# Patient Record
Sex: Female | Born: 2000 | Race: White | Hispanic: No | Marital: Single | Smoking: Never smoker
Health system: Southern US, Community
[De-identification: ages and names within clinical notes are randomized; demographics above are authoritative.]

## PROBLEM LIST (undated history)

## (undated) DIAGNOSIS — F329 Major depressive disorder, single episode, unspecified: Secondary | ICD-10-CM

## (undated) DIAGNOSIS — G479 Sleep disorder, unspecified: Secondary | ICD-10-CM

## (undated) DIAGNOSIS — Z23 Encounter for immunization: Secondary | ICD-10-CM

## (undated) DIAGNOSIS — F32A Depression, unspecified: Secondary | ICD-10-CM

## (undated) DIAGNOSIS — F419 Anxiety disorder, unspecified: Secondary | ICD-10-CM

## (undated) HISTORY — DX: Anxiety disorder, unspecified: F41.9

## (undated) HISTORY — DX: Sleep disorder, unspecified: G47.9

## (undated) HISTORY — DX: Depression, unspecified: F32.A

## (undated) HISTORY — DX: Encounter for immunization: Z23

---
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
## 2018-12-17 ENCOUNTER — Ambulatory Visit (INDEPENDENT_AMBULATORY_CARE_PROVIDER_SITE_OTHER): Payer: 59 | Admitting: Obstetrics and Gynecology

## 2018-12-17 ENCOUNTER — Other Ambulatory Visit (HOSPITAL_COMMUNITY)
Admission: RE | Admit: 2018-12-17 | Discharge: 2018-12-17 | Disposition: A | Discharge status: Home / Self Care | Payer: 59 | Source: Ambulatory Visit | Attending: Obstetrics and Gynecology | Admitting: Obstetrics and Gynecology

## 2018-12-17 ENCOUNTER — Other Ambulatory Visit: Payer: Self-pay

## 2018-12-17 ENCOUNTER — Encounter: Payer: Self-pay | Admitting: Obstetrics and Gynecology

## 2018-12-17 ENCOUNTER — Other Ambulatory Visit: Payer: Self-pay | Admitting: Obstetrics and Gynecology

## 2018-12-17 VITALS — BP 108/70 | Ht 68.0 in | Wt 130.0 lb

## 2018-12-17 DIAGNOSIS — Z01419 Encounter for gynecological examination (general) (routine) without abnormal findings: Secondary | ICD-10-CM | POA: Diagnosis not present

## 2018-12-17 DIAGNOSIS — Z113 Encounter for screening for infections with a predominantly sexual mode of transmission: Secondary | ICD-10-CM | POA: Insufficient documentation

## 2018-12-17 DIAGNOSIS — Z30430 Encounter for insertion of intrauterine contraceptive device: Secondary | ICD-10-CM

## 2018-12-17 DIAGNOSIS — Z803 Family history of malignant neoplasm of breast: Secondary | ICD-10-CM | POA: Insufficient documentation

## 2018-12-17 DIAGNOSIS — Z30014 Encounter for initial prescription of intrauterine contraceptive device: Secondary | ICD-10-CM

## 2018-12-17 MED ORDER — KYLEENA 19.5 MG IU IUD: 19.5000 mg | INTRAUTERINE_SYSTEM | Freq: Once | INTRAUTERINE | 0 refills | Status: AC

## 2018-12-17 NOTE — Patient Instructions (Signed)
I value your feedback and entrusting us with your care. If you get a [facility] patient survey, I would appreciate you taking the time to let us know about your experience today. Thank you! 

## 2018-12-17 NOTE — Progress Notes (Signed)
PCP:  Patient, No Pcp Per   Chief Complaint  Patient presents with  . Gynecologic Exam  . Contraception    Interested in IUD, not on [facility]     HPI:      Ms. Teresa Cowan is a 18 y.o. No obstetric history on file. who LMP was Patient's last menstrual period was 12/15/2018 (exact date)., presents today for her NP annual examination.  Her menses are regular every 28-30 days, lasting 7 days.  Dysmenorrhea mild, occurring first 1-2 days of flow. She does not have intermenstrual bleeding.  Sex activity: single partner, contraception - condoms. Would like IUD. Never been on BC in past. No hx of HTN, DVT, migraines with aura. Last Pap: N/A Hx of STDs: none  There is a FH of breast cancer in her MGM and mat aunt, genetic testing not done. There is no FH of ovarian cancer. The patient does not do self-breast exams.  Tobacco use: The patient denies current or previous tobacco use. Alcohol use: social drinker No drug use.  Exercise: not active  She does get adequate calcium and Vitamin D in her diet. Gardasil completed.   Past Medical History:  Diagnosis Date  . Anxiety   . Depression   . Sleep disorder   . Vaccine for human papilloma virus (HPV) types 6, 11, 16, and 18 administered     History reviewed. No pertinent surgical history.  Family History  Problem Relation Age of Onset  . Fibroids Mother   . Breast cancer Maternal Aunt 35  . Breast cancer Maternal Grandmother 72  . Breast cancer Maternal Aunt 4    Social History   Socioeconomic History  . Marital status: Single    Spouse name: Not on file  . Number of children: Not on file  . Years of education: Not on file  . Highest education level: Not on file  Occupational History  . Not on file  Social Needs  . Financial resource strain: Not on file  . Food insecurity    Worry: Not on file    Inability: Not on file  . Transportation needs    Medical: Not on file    Non-medical: Not on file  Tobacco Use  .  Smoking status: Never Smoker  . Smokeless tobacco: Never Used  Substance and Sexual Activity  . Alcohol use: Yes  . Drug use: Never  . Sexual activity: Yes    Birth control/protection: Condom  Lifestyle  . Physical activity    Days per week: Not on file    Minutes per session: Not on file  . Stress: Not on file  Relationships  . Social Herbalist on phone: Not on file    Gets together: Not on file    Attends religious service: Not on file    Active member of club or organization: Not on file    Attends meetings of clubs or organizations: Not on file    Relationship status: Not on file  . Intimate partner violence    Fear of current or ex partner: Not on file    Emotionally abused: Not on file    Physically abused: Not on file    Forced sexual activity: Not on file  Other Topics Concern  . Not on file  Social History Narrative  . Not on file     Current Outpatient Medications:  .  mirtazapine (REMERON) 15 MG tablet, take 1 tablet by mouth at bedtime FOR 30 DAYS,  Disp: , Rfl:  .  Levonorgestrel (KYLEENA) 19.5 MG IUD, 1 each (19.5 mg total) by Intrauterine route once for 1 dose., Disp: 1 Intra Uterine Device, Rfl: 0     ROS:  Review of Systems  Constitutional: Negative for fatigue, fever and unexpected weight change.  Respiratory: Negative for cough, shortness of breath and wheezing.   Cardiovascular: Negative for chest pain, palpitations and leg swelling.  Gastrointestinal: Negative for blood in stool, constipation, diarrhea, nausea and vomiting.  Endocrine: Negative for cold intolerance, heat intolerance and polyuria.  Genitourinary: Negative for dyspareunia, dysuria, flank pain, frequency, genital sores, hematuria, menstrual problem, pelvic pain, urgency, vaginal bleeding, vaginal discharge and vaginal pain.  Musculoskeletal: Negative for back pain, joint swelling and myalgias.  Skin: Negative for rash.  Neurological: Negative for dizziness, syncope,  light-headedness, numbness and headaches.  Hematological: Negative for adenopathy.  Psychiatric/Behavioral: Negative for agitation, confusion, sleep disturbance and suicidal ideas. The patient is not nervous/anxious.   BREAST: No symptoms   Objective: BP 108/70   Ht 5\' 8"  (1.727 m)   Wt 130 lb (59 kg)   LMP 12/15/2018 (Exact Date)   BMI 19.77 kg/m    Physical Exam Constitutional:      Appearance: She is well-developed.  Genitourinary:     Vulva, vagina, cervix, uterus, right adnexa and left adnexa normal.     No vulval lesion or tenderness noted.     No vaginal discharge, erythema or tenderness.     No cervical polyp.     Uterus is not enlarged or tender.     No right or left adnexal mass present.     Right adnexa not tender.     Left adnexa not tender.  Neck:     Musculoskeletal: Normal range of motion.     Thyroid: No thyromegaly.  Cardiovascular:     Rate and Rhythm: Normal rate and regular rhythm.     Heart sounds: Normal heart sounds. No murmur.  Pulmonary:     Effort: Pulmonary effort is normal.     Breath sounds: Normal breath sounds.  Chest:     Breasts:        Right: No mass, nipple discharge, skin change or tenderness.        Left: No mass, nipple discharge, skin change or tenderness.  Abdominal:     Palpations: Abdomen is soft.     Tenderness: There is no abdominal tenderness. There is no guarding.  Musculoskeletal: Normal range of motion.  Neurological:     General: No focal deficit present.     Mental Status: She is alert and oriented to person, place, and time.     Cranial Nerves: No cranial nerve deficit.  Skin:    General: Skin is warm and dry.  Psychiatric:        Mood and Affect: Mood normal.        Behavior: Behavior normal.        Thought Content: Thought content normal.        Judgment: Judgment normal.  Vitals signs reviewed.    IUD Insertion Procedure Note Patient identified, informed consent performed, consent signed.   Discussed  risks of irregular bleeding, cramping, infection, malpositioning or misplacement of the IUD outside the uterus which may require further procedure such as laparoscopy, risk of failure <1%. Time out was performed.    Speculum placed in the vagina.  Cervix visualized.  Cleaned with Betadine x 2.  Grasped anteriorly with a single tooth tenaculum.  Uterus sounded  to 7.0 cm.   IUD placed per manufacturer's recommendations.  Strings trimmed to 3 cm. Tenaculum was removed, good hemostasis noted.  Patient tolerated procedure well.    Assessment/Plan: Encounter for annual routine gynecological examination  Screening for STD (sexually transmitted disease) - Plan: Cervicovaginal ancillary only  Encounter for initial prescription of intrauterine contraceptive device (IUD) - Plan: Levonorgestrel (KYLEENA) 19.5 MG IUD; Pros/cons/risks/benefits discussed. Pt on menses today and would like to proceed with Kyleela IUD.  Family history of breast cancer--MyRisk testing discussed for pt's mom or affected mat aunt. Pt qualifies for testing age 18. Handout given.  Encounter for IUD insertion - Plan: Levonorgestrel (KYLEENA) 19.5 MG IUD  Meds ordered this encounter  Medications  . Levonorgestrel (KYLEENA) 19.5 MG IUD    Sig: 1 each (19.5 mg total) by Intrauterine route once for 1 dose.    Dispense:  1 Intra Uterine Device    Refill:  0    Order Specific Question:   Supervising Provider    Answer:   Nadara MustardHARRIS, ROBERT P [[id number]]             GYN counsel family planning choices, adequate intake of calcium and vitamin D, diet and exercise  She was advised to have backup contraception for one week.   Call if you are having increasing pain, cramps or bleeding or if you have a fever greater than 100.4 degrees F., shaking chills, nausea or vomiting. Patient was also asked to check IUD strings periodically and follow up in 4 weeks for IUD check.    F/U  Return in about 4 weeks (around 01/14/2019) for IUD f/u.  Alicia  B. Copland, PA-C 12/17/2018 2:48 PM

## 2018-12-19 LAB — CERVICOVAGINAL ANCILLARY ONLY
Chlamydia: NEGATIVE
Neisseria Gonorrhea: NEGATIVE

## 2019-01-28 ENCOUNTER — Encounter: Payer: Self-pay | Admitting: Obstetrics and Gynecology

## 2019-01-28 ENCOUNTER — Other Ambulatory Visit: Payer: Self-pay

## 2019-01-28 ENCOUNTER — Ambulatory Visit (INDEPENDENT_AMBULATORY_CARE_PROVIDER_SITE_OTHER): Payer: 59 | Admitting: Obstetrics and Gynecology

## 2019-01-28 VITALS — BP 90/60 | Ht 68.0 in | Wt 130.0 lb

## 2019-01-28 DIAGNOSIS — Z30431 Encounter for routine checking of intrauterine contraceptive device: Secondary | ICD-10-CM

## 2019-01-28 DIAGNOSIS — L700 Acne vulgaris: Secondary | ICD-10-CM

## 2019-01-28 NOTE — Progress Notes (Signed)
   Chief Complaint  Patient presents with  . IUD check    pts face is breaking out a lot     History of Present Illness:  Teresa Cowan is a 18 y.o. that had a [place] IUD placed approximately 5 weeks ago. Since that time, she has had daily light spotting for most of the time, although less the past 2 days. No heavy bleeding, unusual d/c. Pt has not been sex active since placed. No dysmen. She has had increased acne on her cheeks, cystic initially but getting a little better now. Treating with acne face wash. Hx of adolescent acne in past, treated with Epiduo in past.   Review of Systems  Constitutional: Negative for fever.  Gastrointestinal: Negative for blood in stool, constipation, diarrhea, nausea and vomiting.  Genitourinary: Negative for dyspareunia, dysuria, flank pain, frequency, hematuria, urgency, vaginal bleeding, vaginal discharge and vaginal pain.  Musculoskeletal: Negative for back pain.  Skin: Negative for rash.    Physical Exam:  BP 90/60   Ht 5\' 8"  (1.727 m)   Wt 130 lb (59 kg)   BMI 19.77 kg/m  Body mass index is 19.77 kg/m.  Pelvic exam:  Two IUD strings present seen coming from the cervical os. EGBUS, vaginal vault and cervix: within normal limits   Assessment:   Encounter for routine checking of intrauterine contraceptive device (IUD)--IUD strings present in proper location; pt doing well  Breakthrough bleeding with IUD--reassurance. Normal. F/u prn.  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 Should improve, cont OTC acne tx. Can try differin gel. Will Rx epiduo prn. Also discussed IUD removal if sx don't improve.    Plan: F/u if any signs of infection or can no longer feel the strings.   Alicia B. Copland, PA-C 01/28/2019 3:52 PM

## 2019-01-28 NOTE — Patient Instructions (Signed)
I value your feedback and entrusting us with your care. If you get a [facility] patient survey, I would appreciate you taking the time to let us know about your experience today. Thank you! 

## 2019-11-12 ENCOUNTER — Encounter: Payer: Self-pay | Admitting: Obstetrics and Gynecology

## 2020-01-25 ENCOUNTER — Telehealth: Payer: Self-pay

## 2020-01-25 NOTE — Telephone Encounter (Signed)
Pt qualifies but since her mom was just diagnosed with breast cancer, her mom should do genetic testing with her oncologist. If her mom is negative, then pt is neg. If her mom is positive, then we'll test pt. Don't do any intervention until age 19 anyway.

## 2020-01-25 NOTE — Telephone Encounter (Signed)
Pt left msg on triage asking for her fam history to be updated. Her mom was just diagnosed with Breast Cancer at age 21- Stage 1 Grade 2. She wants to know more abot genetic testing you and her had talked to her. Advised she will be due for her annual as of 10/28. She wants your advise, is this something she should get to right away? Pt will call back to schedule annual.

## 2020-01-26 NOTE — Telephone Encounter (Signed)
Called pt, no answer, mail box full unable to leave voice msg. 

## 2020-12-17 ENCOUNTER — Ambulatory Visit
Admission: EM | Admit: 2020-12-17 | Discharge: 2020-12-17 | Disposition: A | Discharge status: Home / Self Care | Payer: BC Managed Care – PPO

## 2020-12-17 ENCOUNTER — Other Ambulatory Visit: Payer: Self-pay

## 2020-12-17 DIAGNOSIS — E860 Dehydration: Secondary | ICD-10-CM | POA: Diagnosis not present

## 2020-12-17 DIAGNOSIS — R350 Frequency of micturition: Secondary | ICD-10-CM | POA: Diagnosis not present

## 2020-12-17 LAB — POCT URINALYSIS DIP (MANUAL ENTRY)
Bilirubin, UA: NEGATIVE
Glucose, UA: NEGATIVE mg/dL
Ketones, POC UA: NEGATIVE mg/dL
Leukocytes, UA: NEGATIVE
Nitrite, UA: NEGATIVE
Protein Ur, POC: 100 mg/dL — AB
Spec Grav, UA: >=1.03 — AB (ref 1.010–1.025)
Urobilinogen, UA: 0.2 E.U./dL
pH, UA: 6 (ref 5.0–8.0)

## 2020-12-17 NOTE — Discharge Instructions (Addendum)
Push fluids  Return with any worsening symptoms, fever, unilateral back pain, vomiting

## 2020-12-17 NOTE — ED Provider Notes (Signed)
Subjective:    Teresa Cowan is a very pleasant 20 y.o. female who presents with concerns for UTI due to urinary frequency. No unilateral back pain, vomiting, fever, vaginal discharge, concern for STD.  Past medical history, past surgical history, current medications reviewed.  Allergies: has No Known Allergies.  Review of Systems See HPI   Objective:     Vitals:   12/17/20 1347  BP: 117/79  Pulse: 80  Resp: 16  Temp: 97.7 F (36.5 C)  SpO2: 97%     General: Appears well-developed and well-nourished. No acute distress.  Cardiovascular: Normal rate Pulm/Chest: No respiratory distress Neurological: Alert and oriented to person, place, and time.  Skin: Skin is warm and dry.  Psychiatric: Normal mood, affect, behavior, and thought content.  GU:  Deferred secondary to self collect specimen  Laboratory:  Orders Placed This Encounter  Procedures   POCT urinalysis dipstick   Results for orders placed or performed during the hospital encounter of 12/17/20  POCT urinalysis dipstick  Result Value Ref Range   Color, UA yellow yellow   Clarity, UA clear clear   Glucose, UA negative negative mg/dL   Bilirubin, UA negative negative   Ketones, POC UA negative negative mg/dL   Spec Grav, UA >=1.062 (A) 1.010 - 1.025   Blood, UA trace-intact (A) negative   pH, UA 6.0 5.0 - 8.0   Protein Ur, POC =100 (A) negative mg/dL   Urobilinogen, UA 0.2 0.2 or 1.0 E.U./dL   Nitrite, UA Negative Negative   Leukocytes, UA Negative Negative     Assessment:   1. Dehydration  2. Urinary frequency  Plan:   MDM: Patient presents with concerns for UTI due to urinary frequency. No unilateral back pain, vomiting, fever, vaginal discharge, concern for STD. Urinalysis shows increased specific gravity, trace blood, and 100 protein. No concern for UTI today, no leuks and no nitrites. Likely dehydration with urinary frequency. Push fluids. Return with any worsening symptoms to include fever,  chills, unilateral back pain, vomiting.    Discharge Instructions      Push fluids  Return with any worsening symptoms, fever, unilateral back pain, vomiting         Amalia Greenhouse, [place] 12/17/20 1405

## 2020-12-17 NOTE — ED Triage Notes (Signed)
Patient presents to Urgent Care with complaints of UTI symptoms. She has had freq UTIs in the past two months.   Denies fever.

## 2021-02-13 ENCOUNTER — Ambulatory Visit
Admission: EM | Admit: 2021-02-13 | Discharge: 2021-02-13 | Disposition: A | Discharge status: Home / Self Care | Payer: BC Managed Care – PPO

## 2021-02-13 ENCOUNTER — Encounter: Payer: Self-pay | Admitting: Emergency Medicine

## 2021-02-13 DIAGNOSIS — J0100 Acute maxillary sinusitis, unspecified: Secondary | ICD-10-CM

## 2021-02-13 MED ORDER — AMOXICILLIN 875 MG PO TABS: 875.0000 mg | ORAL_TABLET | Freq: Two times a day (BID) | ORAL | 0 refills | Status: AC

## 2021-02-13 NOTE — Discharge Instructions (Addendum)
Take the amoxicillin as directed.  Continue taking plain Mucinex and ibuprofen as directed.  Follow up with your primary care provider if your symptoms are not improving.

## 2021-02-13 NOTE — ED Triage Notes (Signed)
Pt c/o runny nose (green mucus), sinus pressure/pain, cough, bilateral ear pain x 1 week

## 2021-02-13 NOTE — ED Provider Notes (Signed)
[facility]    CSN: [id number] Arrival date & time: 02/13/21  1530      History   Chief Complaint Chief Complaint  Patient presents with   Cough   Sinus pressure    HPI Teresa Cowan is a 20 y.o. female.  Patient presents with >1 week history of ear pain, runny nose, sinus pressure, congestion, cough.  Her throat is sore when she coughs.  She denies fever, chills, rash, shortness of breath, vomiting, diarrhea, other symptoms.  Treatment at home with Mucinex.  The history is provided by the patient.   Past Medical History:  Diagnosis Date   Anxiety    Depression    Sleep disorder    Vaccine for human papilloma virus (HPV) types 6, 11, 16, and 18 administered     Patient Active Problem List   Diagnosis Date Noted   Acne vulgaris 01/28/2019   Family history of breast cancer 12/17/2018    History reviewed. No pertinent surgical history.  OB History     Gravida  0   Para  0   Term  0   Preterm  0   AB  0   Living  0      SAB  0   IAB  0   Ectopic  0   Multiple  0   Live Births  0            Home Medications    Prior to Admission medications   Medication Sig Start Date End Date Taking? Authorizing Provider  amoxicillin (AMOXIL) 875 MG tablet Take 1 tablet (875 mg total) by mouth 2 (two) times daily for 7 days. 02/13/21 02/20/21 Yes Mickie Bail, NP  clindamycin (CLEOCIN T) 1 % lotion  Start: 08/09/20 13:41:00 EDT, 1 appl, topical, qhs 08/09/20  Yes [provider]  Cyanocobalamin (VITAMIN B 12) 500 MCG TABS 1 tablet 11/10/18  Yes [provider]  levonorgestrel Rutha Bouchard) 19.5 MG IUD  Start: 08/09/20 13:55:00 EDT, 1 each, intrauterine, ONCE, Disp# 1 each, placed in 2020, other 08/09/20  Yes [provider]  mirtazapine (REMERON) 15 MG tablet take 1 tablet by mouth at bedtime FOR 30 DAYS 10/09/18  Yes [provider]  spironolactone (ALDACTONE) 25 MG tablet  Start: 08/09/20 13:41:00 EDT, 1 tab, PO,  Daily 08/09/20  Yes [provider]  atomoxetine (STRATTERA) 18 MG capsule 1 capsule 06/06/20   [provider]  Cyanocobalamin (VITAMIN B 12) 500 MCG TABS 1 tablet 11/10/18   [provider]  Levonorgestrel (KYLEENA) 19.5 MG IUD 1 each (19.5 mg total) by Intrauterine route once for 1 dose. 12/17/18 12/17/18  Copland, Ilona Sorrel, PA-C  mirtazapine (REMERON) 15 MG tablet Take 1 tablet by mouth at bedtime. 08/09/20   [provider]    Family History Family History  Problem Relation Age of Onset   Fibroids Mother    Breast cancer Mother 51       Stage 1 Grade 2   Breast cancer Maternal Aunt 35   Breast cancer Maternal Grandmother 17   Breast cancer Maternal Aunt 70    Social History Social History   Tobacco Use   Smoking status: Never   Smokeless tobacco: Never  Vaping Use   Vaping Use: Some days  Substance Use Topics   Alcohol use: Yes   Drug use: Never     Allergies   Patient has no known allergies.   Review of Systems Review of Systems  Constitutional:  Negative for chills and fever.  HENT:  Positive for congestion, ear pain, postnasal drip, rhinorrhea and sinus pressure. Negative for sore throat.   Respiratory:  Positive for cough. Negative for shortness of breath.   Cardiovascular:  Negative for chest pain and palpitations.  Gastrointestinal:  Negative for diarrhea and vomiting.  Skin:  Negative for color change and rash.  All other systems reviewed and are negative.   Physical Exam Triage Vital Signs ED Triage Vitals  Enc Vitals Group     BP      Pulse      Resp      Temp      Temp src      SpO2      Weight      Height      Head Circumference      Peak Flow      Pain Score      Pain Loc      Pain Edu?      Excl. in GC?    No data found.  Updated Vital Signs BP 102/67 (BP Location: Left Arm)   Pulse 76   Temp 98.2 F (36.8 C) (Oral)   Resp 16   SpO2 97%   Visual Acuity Right Eye Distance:   Left Eye Distance:    Bilateral Distance:    Right Eye Near:   Left Eye Near:    Bilateral Near:     Physical Exam Vitals and nursing note reviewed.  Constitutional:      General: She is not in acute distress.    Appearance: She is well-developed.  HENT:     Head: Normocephalic and atraumatic.     Right Ear: Tympanic membrane normal.     Left Ear: Tympanic membrane normal.     Nose: Congestion and rhinorrhea present.     Mouth/Throat:     Mouth: Mucous membranes are moist.     Pharynx: Oropharynx is clear.  Eyes:     Conjunctiva/sclera: Conjunctivae normal.  Cardiovascular:     Rate and Rhythm: Normal rate and regular rhythm.     Heart sounds: Normal heart sounds.  Pulmonary:     Effort: Pulmonary effort is normal. No respiratory distress.     Breath sounds: Normal breath sounds.  Abdominal:     Palpations: Abdomen is soft.     Tenderness: There is no abdominal tenderness.  Musculoskeletal:     Cervical back: Neck supple.  Skin:    General: Skin is warm and dry.  Neurological:     Mental Status: She is alert.  Psychiatric:        Mood and Affect: Mood normal.        Behavior: Behavior normal.     UC Treatments / Results  Labs (all labs ordered are listed, but only abnormal results are displayed) Labs Reviewed - No data to display  EKG   Radiology No results found.  Procedures Procedures (including critical care time)  Medications Ordered in UC Medications - No data to display  Initial Impression / Assessment and Plan / UC Course  I have reviewed the triage vital signs and the nursing notes.  Pertinent labs & imaging results that were available during my care of the patient were reviewed by me and considered in my medical decision making (see chart for details).  Acute sinusitis.  Patient has been symptomatic for >1 week.  Treating with amoxicillin.  Discussed ongoing symptomatic treatment with Mucinex and ibuprofen.  Instructed her to  follow-up with her PCP if her symptoms  are not improving.  She agrees to plan of care.   Final Clinical Impressions(s) / UC Diagnoses   Final diagnoses:  Acute non-recurrent maxillary sinusitis     Discharge Instructions      Take the amoxicillin as directed.  Continue taking plain Mucinex and ibuprofen as directed.  Follow up with your primary care provider if your symptoms are not improving.        ED Prescriptions     Medication Sig Dispense Auth. Provider   amoxicillin (AMOXIL) 875 MG tablet Take 1 tablet (875 mg total) by mouth 2 (two) times daily for 7 days. 14 tablet Mickie Bail, NP      PDMP not reviewed this encounter.   Mickie Bail, NP 02/13/21 1651

## 2021-03-02 ENCOUNTER — Ambulatory Visit
Admission: RE | Admit: 2021-03-02 | Discharge: 2021-03-02 | Disposition: A | Discharge status: Home / Self Care | Payer: BC Managed Care – PPO | Source: Ambulatory Visit | Attending: Family Medicine | Admitting: Family Medicine

## 2021-03-02 ENCOUNTER — Other Ambulatory Visit: Payer: Self-pay

## 2021-03-02 ENCOUNTER — Ambulatory Visit (INDEPENDENT_AMBULATORY_CARE_PROVIDER_SITE_OTHER): Payer: BC Managed Care – PPO

## 2021-03-02 VITALS — BP 110/70 | HR 78 | Temp 99.0°F | Resp 16

## 2021-03-02 DIAGNOSIS — R059 Cough, unspecified: Secondary | ICD-10-CM

## 2021-03-02 DIAGNOSIS — R052 Subacute cough: Secondary | ICD-10-CM

## 2021-03-02 DIAGNOSIS — R079 Chest pain, unspecified: Secondary | ICD-10-CM

## 2021-03-02 DIAGNOSIS — R0789 Other chest pain: Secondary | ICD-10-CM

## 2021-03-02 MED ORDER — PREDNISONE 20 MG PO TABS: 20.0000 mg | ORAL_TABLET | Freq: Every day | ORAL | 0 refills | Status: AC

## 2021-03-02 MED ORDER — PROMETHAZINE-DM 6.25-15 MG/5ML PO SYRP: 5.0000 mL | ORAL_SOLUTION | Freq: Four times a day (QID) | ORAL | 0 refills | Status: AC | PRN

## 2021-03-02 NOTE — Discharge Instructions (Addendum)
Treating you for inflammation of the chest wall likely secondary to coughing.  X-ray showed no rib fracture and no evolving lung infection.  Start prednisone 20 mg once daily for the next 5 days.  For management of cough promethazine DM 3 times daily.  If symptoms persist in spite of this treatment would recommend further evaluation by your primary care doctor or possibly an asthma and allergy specialist.

## 2021-03-02 NOTE — ED Triage Notes (Signed)
Pt c/o cough and chest congestion. The cough has caused her to have pain on the left side of her chest x 2 days. Pt has had cough for several weeks and was seen twice and prescribed abx each time, but she is not better.

## 2021-03-02 NOTE — ED Provider Notes (Signed)
Renaldo Fiddler    CSN: [id number] Arrival date & time: 03/02/21  1645      History   Chief Complaint Chief Complaint  Patient presents with   Chest Pain   Cough    HPI Teresa Cowan is a 20 y.o. female.   HPI Patient presents today with ongoing cough and left-sided lower chest pain x2 days.  Patient has had a cough and treated for both bronchitis and bronchopneumonia with 2 rounds of antibiotics consisting of doxycycline and Levaquin on 02/14/2021 and 02/21/2021 .  She has not had any imaging.  She does not have any history of asthma.  She reports that her breathing precipitates her chest pain and the chest pain is reproducible if she touches it.  She describes the pain as occasionally sharp but always present.  She did complete both rounds of antibiotics has not been prescribed any steroids. Past Medical History:  Diagnosis Date   Anxiety    Depression    Sleep disorder    Vaccine for human papilloma virus (HPV) types 6, 11, 16, and 18 administered     Patient Active Problem List   Diagnosis Date Noted   Acne vulgaris 01/28/2019   Family history of breast cancer 12/17/2018    History reviewed. No pertinent surgical history.  OB History     Gravida  0   Para  0   Term  0   Preterm  0   AB  0   Living  0      SAB  0   IAB  0   Ectopic  0   Multiple  0   Live Births  0            Home Medications    Prior to Admission medications   Medication Sig Start Date End Date Taking? Authorizing Provider  predniSONE (DELTASONE) 20 MG tablet Take 1 tablet (20 mg total) by mouth daily with breakfast for 5 days. 03/02/21 03/07/21 Yes Bing Neighbors, FNP  promethazine-dextromethorphan (PROMETHAZINE-DM) 6.25-15 MG/5ML syrup Take 5 mLs by mouth 4 (four) times daily as needed for cough. 03/02/21  Yes Bing Neighbors, FNP  atomoxetine (STRATTERA) 18 MG capsule 1 capsule 06/06/20   [provider]  clindamycin (CLEOCIN T) 1 % lotion  Start:  08/09/20 13:41:00 EDT, 1 appl, topical, qhs 08/09/20   [provider]  Cyanocobalamin (VITAMIN B 12) 500 MCG TABS 1 tablet 11/10/18   [provider]  Cyanocobalamin (VITAMIN B 12) 500 MCG TABS 1 tablet 11/10/18   [provider]  Levonorgestrel (KYLEENA) 19.5 MG IUD 1 each (19.5 mg total) by Intrauterine route once for 1 dose. 12/17/18 12/17/18  Copland, Ilona Sorrel, PA-C  levonorgestrel (KYLEENA) 19.5 MG IUD  Start: 08/09/20 13:55:00 EDT, 1 each, intrauterine, ONCE, Disp# 1 each, placed in 2020, other 08/09/20   [provider]  mirtazapine (REMERON) 15 MG tablet take 1 tablet by mouth at bedtime FOR 30 DAYS 10/09/18   [provider]  mirtazapine (REMERON) 15 MG tablet Take 1 tablet by mouth at bedtime. 08/09/20   [provider]  spironolactone (ALDACTONE) 25 MG tablet  Start: 08/09/20 13:41:00 EDT, 1 tab, PO, Daily 08/09/20   [provider]    Family History Family History  Problem Relation Age of Onset   Fibroids Mother    Breast cancer Mother 1       Stage 1 Grade 2   Breast cancer Maternal Aunt 37   Breast cancer  Maternal Grandmother 28   Breast cancer Maternal Aunt 37    Social History Social History   Tobacco Use   Smoking status: Never   Smokeless tobacco: Never  Vaping Use   Vaping Use: Some days  Substance Use Topics   Alcohol use: Yes   Drug use: Never     Allergies   Patient has no known allergies.   Review of Systems Review of Systems Pertinent negatives listed in HPI   Physical Exam Triage Vital Signs ED Triage Vitals  Enc Vitals Group     BP 03/02/21 1715 110/70     Pulse Rate 03/02/21 1715 78     Resp 03/02/21 1715 18     Temp 03/02/21 1715 99 F (37.2 C)     Temp Source 03/02/21 1715 Oral     SpO2 03/02/21 1715 99 %     Weight --      Height --      Head Circumference --      Peak Flow --      Pain Score 03/02/21 1714 7     Pain Loc --      Pain Edu? --      Excl. in GC? --    No  data found.  Updated Vital Signs BP 110/70 (BP Location: Left Arm)   Pulse 78   Temp 99 F (37.2 C) (Oral)   Resp 16   SpO2 99%   Visual Acuity Right Eye Distance:   Left Eye Distance:   Bilateral Distance:    Right Eye Near:   Left Eye Near:    Bilateral Near:     Physical Exam Vitals reviewed.  Constitutional:      Appearance: She is well-developed.  HENT:     Head: Normocephalic.  Eyes:     Extraocular Movements: Extraocular movements intact.     Pupils: Pupils are equal, round, and reactive to light.  Cardiovascular:     Rate and Rhythm: Normal rate and regular rhythm.  Pulmonary:     Effort: Pulmonary effort is normal.  Chest:     Chest wall: Tenderness present.    Neurological:     Mental Status: She is alert.  Psychiatric:        Attention and Perception: Attention normal.        Mood and Affect: Mood normal.        Behavior: Behavior normal.   UC Treatments / Results  Labs (all labs ordered are listed, but only abnormal results are displayed) Labs Reviewed - No data to display  EKG   Radiology DG Chest 2 View  Result Date: 03/02/2021 CLINICAL DATA:  Cough for 6 weeks with chest pain for 2 days. EXAM: CHEST - 2 VIEW COMPARISON:  None. FINDINGS: A mild pectus excavatum deformity. Midline trachea. Normal heart size and mediastinal contours. No pleural effusion or pneumothorax. Clear lungs. IMPRESSION: No active cardiopulmonary disease. Electronically Signed   By: Jeronimo Greaves M.D.   On: 03/02/2021 18:10    Procedures Procedures (including critical care time)  Medications Ordered in UC Medications - No data to display  Initial Impression / Assessment and Plan / UC Course  I have reviewed the triage vital signs and the nursing notes.  Pertinent labs & imaging results that were available during my care of the patient were reviewed by me and considered in my medical decision making (see chart for details).    Chest x-ray negative suspect pleurisy  and inflammation as the source  of right-sided chest wall pain given patient has been coughing for nearly 6 weeks.  Given two courses of antibiotic treatment, is no longer indicated to treat with an additional round.  We will treat with anti-inflammatory, prednisone 20 mg daily for 5 days.  Promethazine DM PRN for cough.  Final Clinical Impressions(s) / UC Diagnoses   Final diagnoses:  Subacute cough  Right-sided chest wall pain     Discharge Instructions      Treating you for inflammation of the chest wall likely secondary to coughing.  X-ray showed no rib fracture and no evolving lung infection.  Start prednisone 20 mg once daily for the next 5 days.  For management of cough promethazine DM 3 times daily.  If symptoms persist in spite of this treatment would recommend further evaluation by your primary care doctor or possibly an asthma and allergy specialist.     ED Prescriptions     Medication Sig Dispense Auth. Provider   predniSONE (DELTASONE) 20 MG tablet Take 1 tablet (20 mg total) by mouth daily with breakfast for 5 days. 5 tablet Bing Neighbors, FNP   promethazine-dextromethorphan (PROMETHAZINE-DM) 6.25-15 MG/5ML syrup Take 5 mLs by mouth 4 (four) times daily as needed for cough. 140 mL Bing Neighbors, FNP      PDMP not reviewed this encounter.   Bing Neighbors, FNP 03/02/21 [phone]

## 2021-07-03 ENCOUNTER — Other Ambulatory Visit: Payer: Self-pay | Admitting: Certified Nurse Midwife

## 2021-07-11 ENCOUNTER — Other Ambulatory Visit: Payer: Self-pay | Admitting: Certified Nurse Midwife

## 2021-07-11 DIAGNOSIS — R102 Pelvic and perineal pain: Secondary | ICD-10-CM

## 2021-07-18 ENCOUNTER — Ambulatory Visit: Payer: BC Managed Care – PPO

## 2022-12-15 IMAGING — DX DG CHEST 2V
2 series · 2 of 2 positions shown · non-contrast
Comparison: None.

CLINICAL DATA: Cough for 6 weeks with chest pain for 2 days.

EXAM:
CHEST - 2 VIEW

[chest pa]
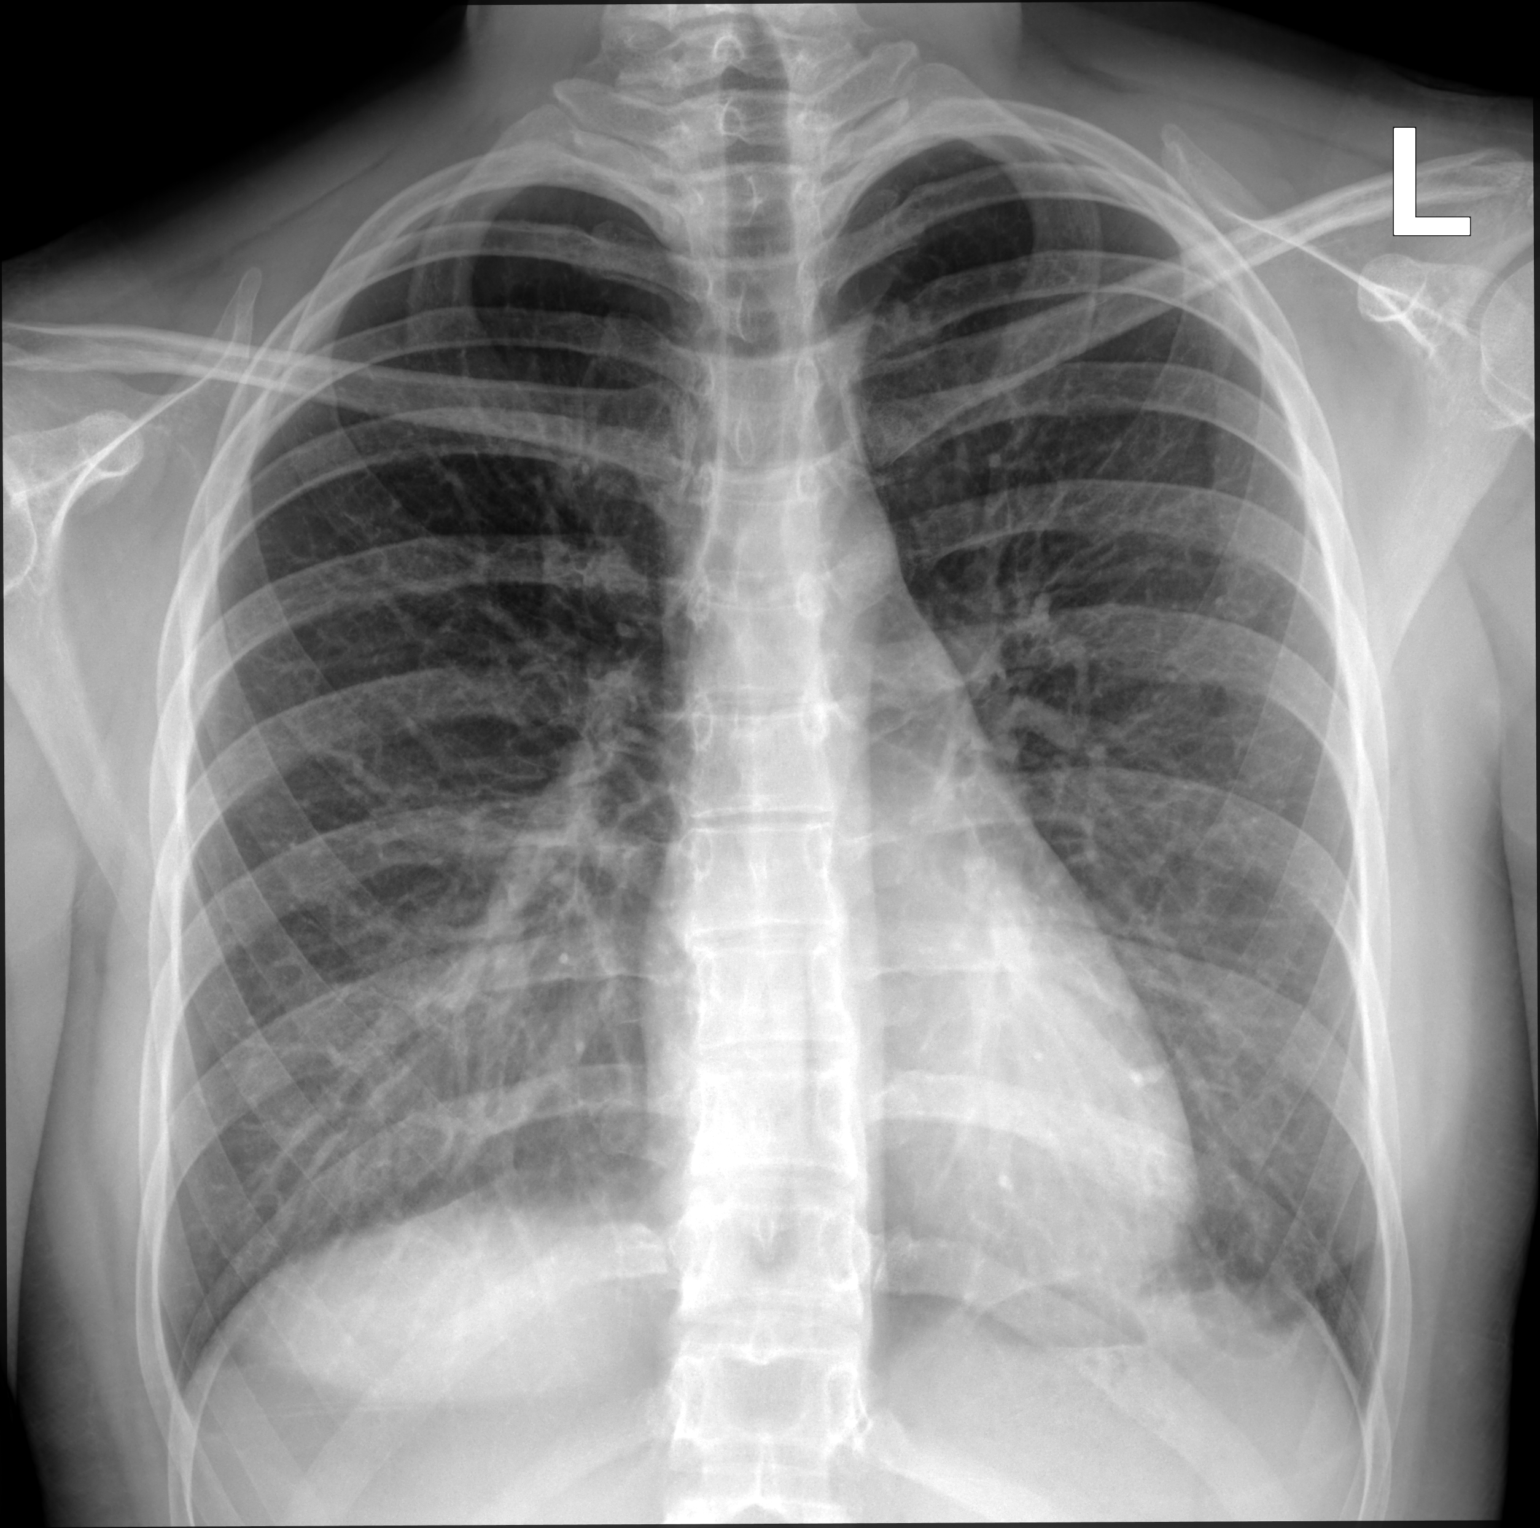

[chest lat]
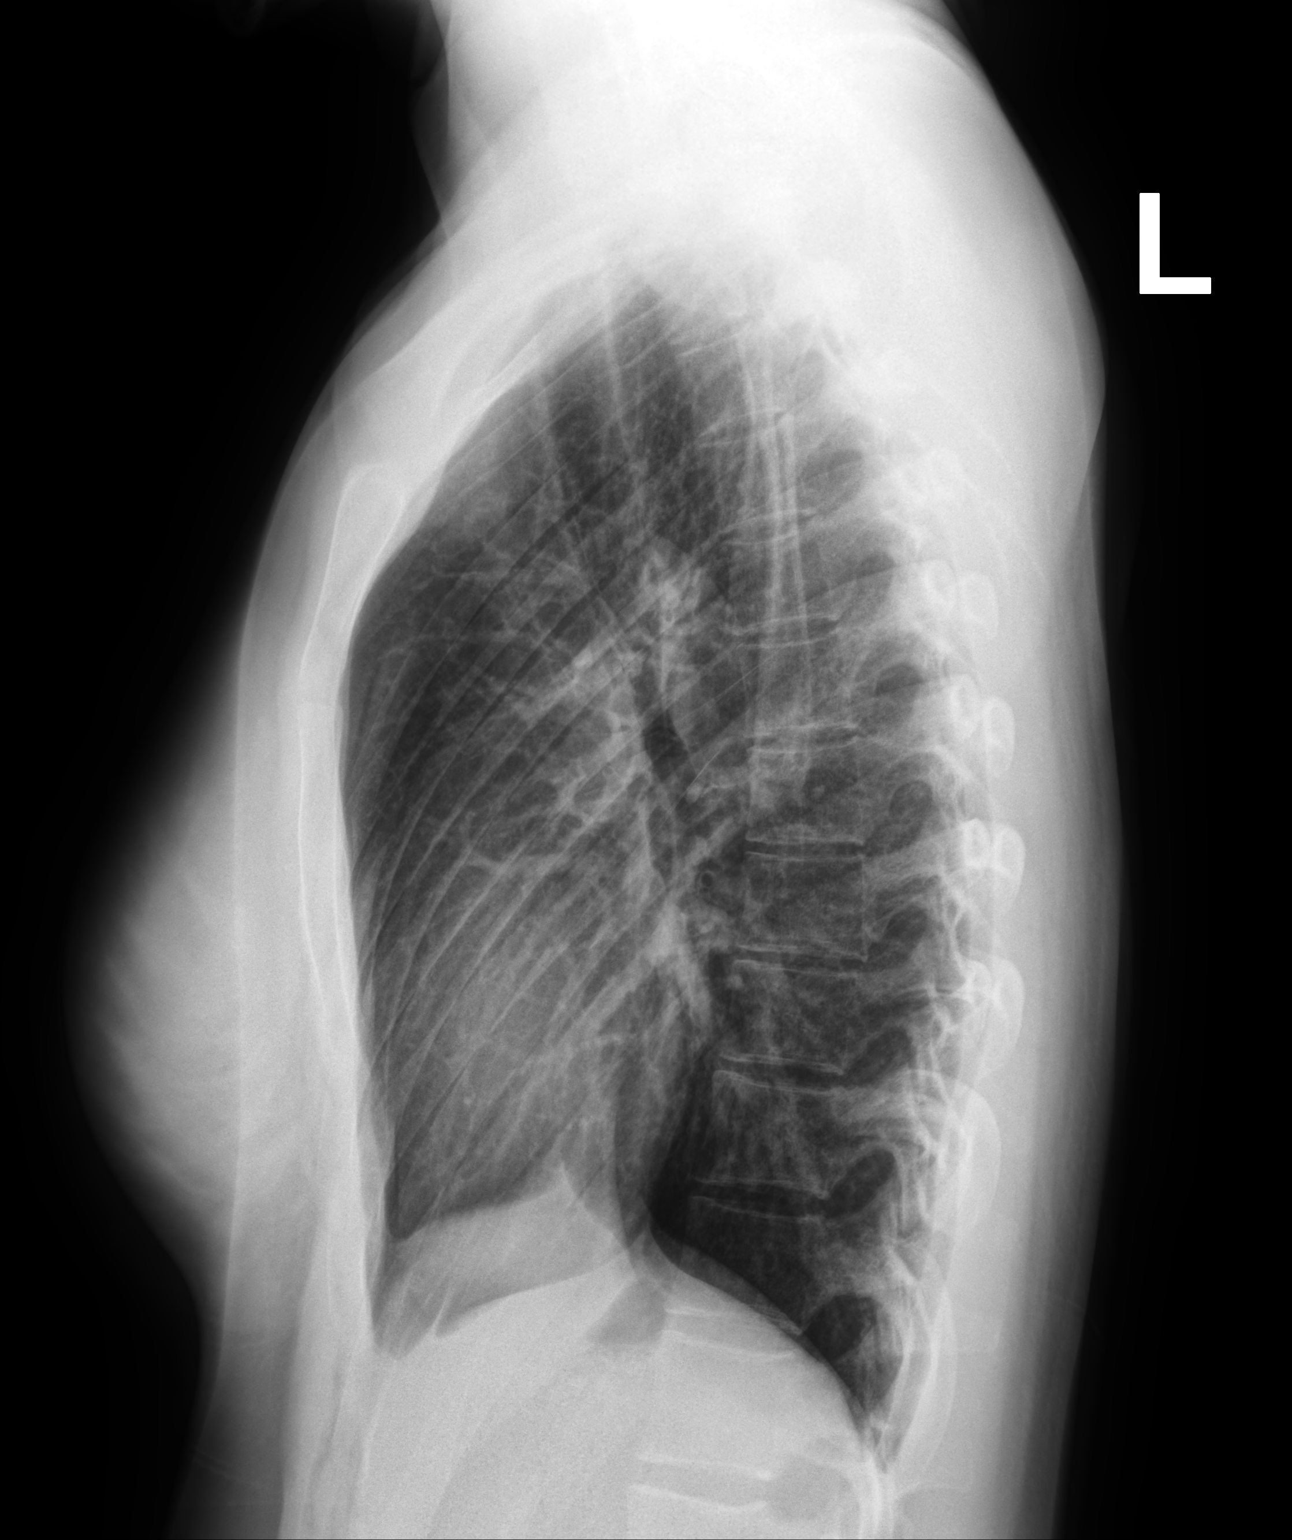

[2 of 2 positions shown; findings below may reference images not displayed]

FINDINGS: A mild pectus excavatum deformity. Midline trachea. Normal heart
size and mediastinal contours. No pleural effusion or pneumothorax.
Clear lungs.
IMPRESSION: No active cardiopulmonary disease.
